# Patient Record
Sex: Male | Born: 1950 | Race: Black or African American | Hispanic: No | Smoking: Never smoker
Health system: Southern US, Community
[De-identification: ages and names within clinical notes are randomized; demographics above are authoritative.]

---

## 1997-06-02 ENCOUNTER — Emergency Department (HOSPITAL_COMMUNITY): Admission: EM | Admit: 1997-06-02 | Discharge: 1997-06-02 | Payer: Self-pay | Admitting: Emergency Medicine

## 1997-06-14 ENCOUNTER — Emergency Department (HOSPITAL_COMMUNITY): Admission: EM | Admit: 1997-06-14 | Discharge: 1997-06-14 | Payer: Self-pay | Admitting: Emergency Medicine

## 2003-02-06 ENCOUNTER — Emergency Department (HOSPITAL_COMMUNITY): Admission: EM | Admit: 2003-02-06 | Discharge: 2003-02-06 | Payer: Self-pay | Admitting: Emergency Medicine

## 2003-11-10 ENCOUNTER — Emergency Department (HOSPITAL_COMMUNITY): Admission: EM | Admit: 2003-11-10 | Discharge: 2003-11-10 | Payer: Self-pay | Admitting: Emergency Medicine

## 2004-04-08 ENCOUNTER — Inpatient Hospital Stay (HOSPITAL_COMMUNITY): Admission: EM | Admit: 2004-04-08 | Discharge: 2004-04-12 | Payer: Self-pay | Admitting: Emergency Medicine

## 2004-04-16 ENCOUNTER — Ambulatory Visit: Payer: Self-pay | Admitting: Family Medicine

## 2004-04-27 ENCOUNTER — Ambulatory Visit (HOSPITAL_COMMUNITY): Admission: RE | Admit: 2004-04-27 | Discharge: 2004-04-27 | Payer: Self-pay | Admitting: Family Medicine

## 2005-10-02 IMAGING — CT CT PELVIS W/ CM
1 of 3 series · 14 of 32 positions shown, 19 images · IV contrast (GASTRO & 120 ML OMNI 300)
Comparison: none

CLINICAL DATA: 52-year-old with lower abdominal pain and nausea.  
CT SCAN OF THE ABDOMEN AND PELVIS WITH ORAL AND IV CONTRAST ? 11/10/2003 
No comparisons.
TECHNIQUE: 120 cc Omnipaque 300 contrast was administered intravenously.  Helical imaging performed through the abdomen and pelvis.

[Series 2: routine abdomen · axial · 0.78mm/px · z∈[-460,-25]mm · 14 of 97 slices shown, 19 images]
[im 5/97  soft-tissue]
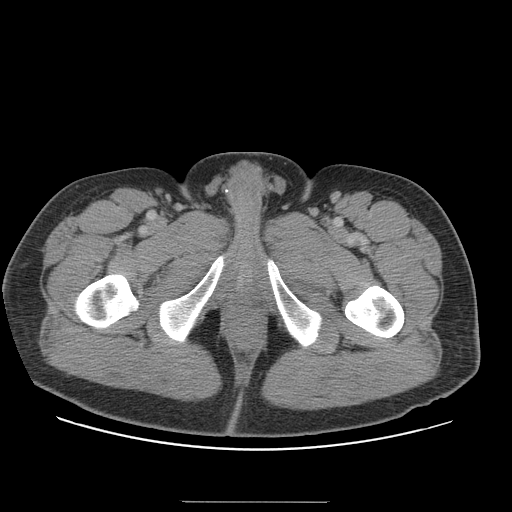
[im 5/97  bone]
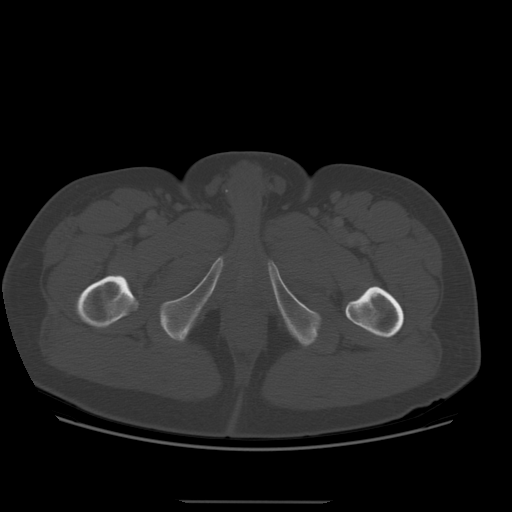
[im 15/97  soft-tissue]
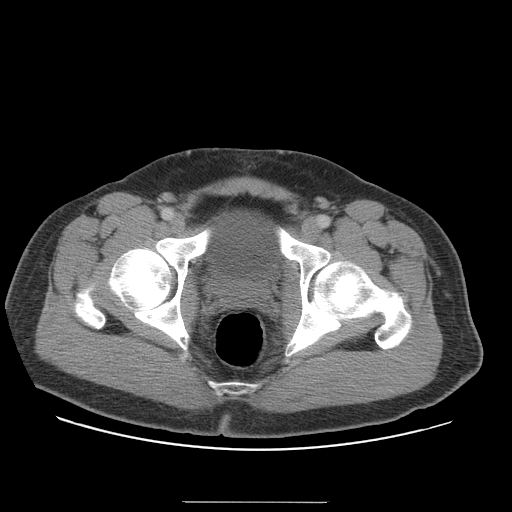
[im 20/97  soft-tissue]
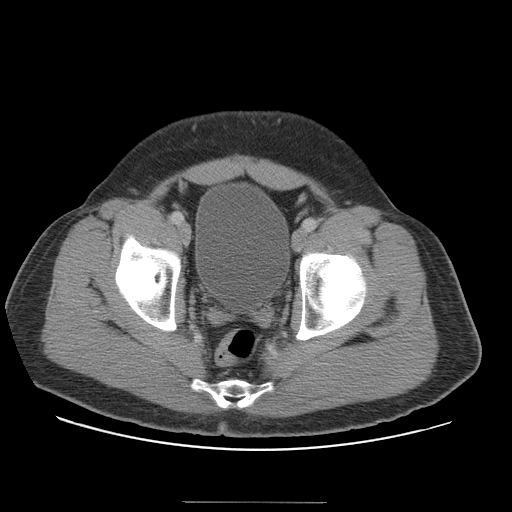
[im 29/97  soft-tissue]
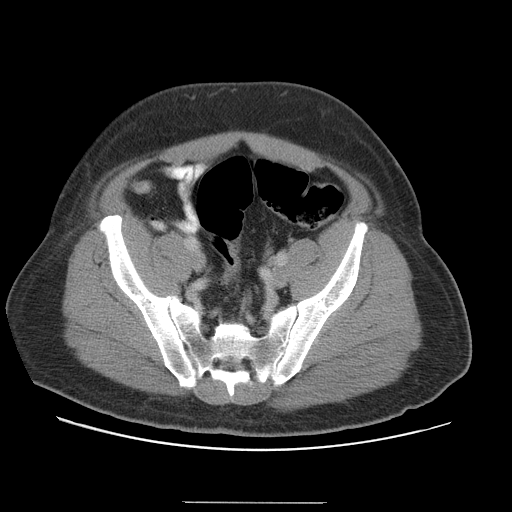
[im 34/97  soft-tissue]
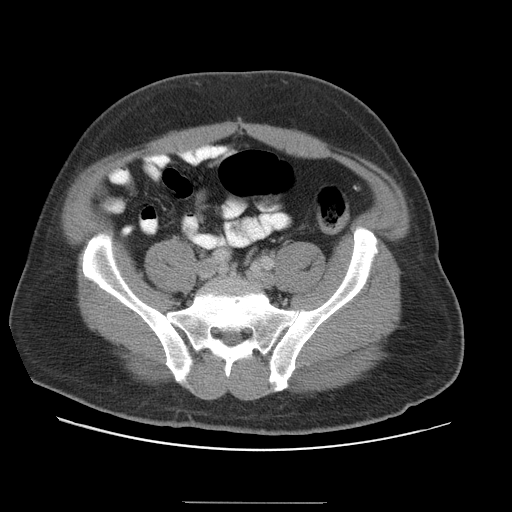
[im 44/97  soft-tissue]
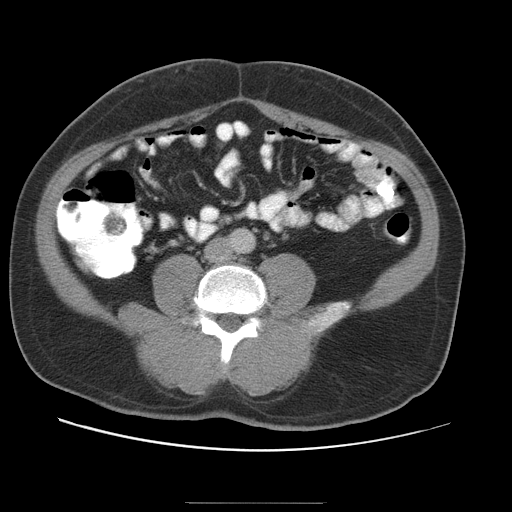
[im 49/97  soft-tissue]
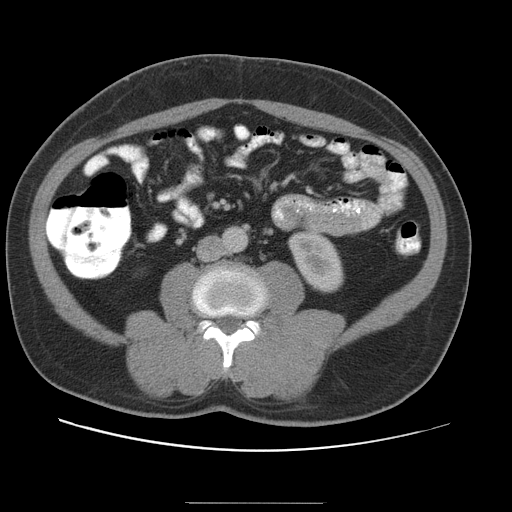
[im 53/97  soft-tissue]
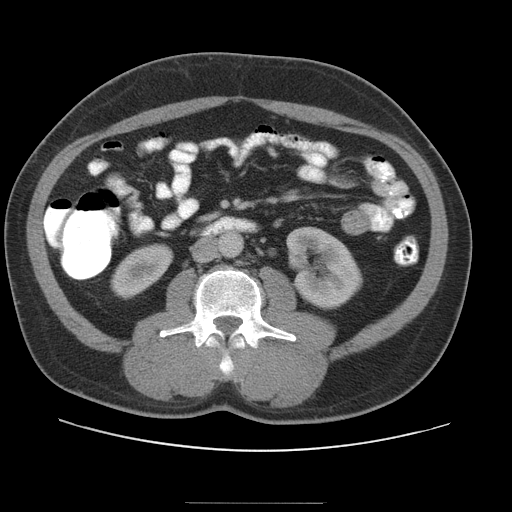
[im 63/97  soft-tissue]
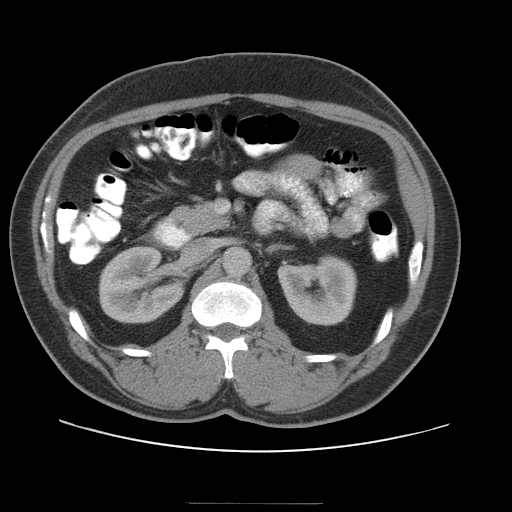
[im 63/97  bone]
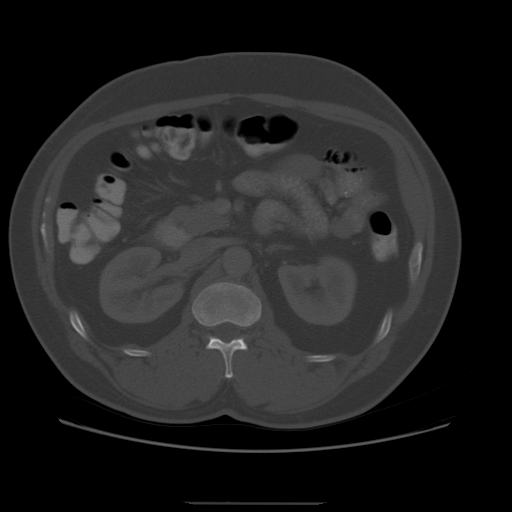
[im 68/97  soft-tissue]
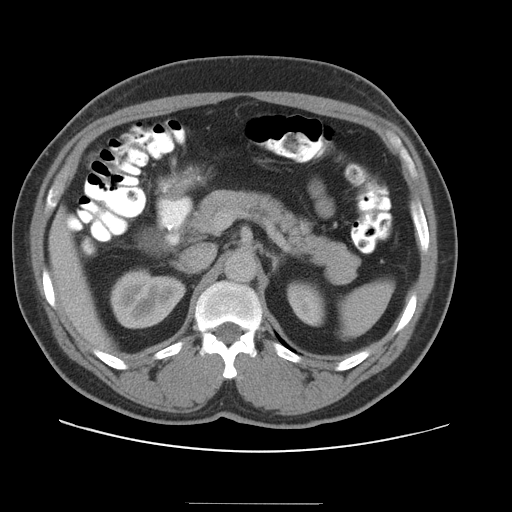
[im 77/97  soft-tissue]
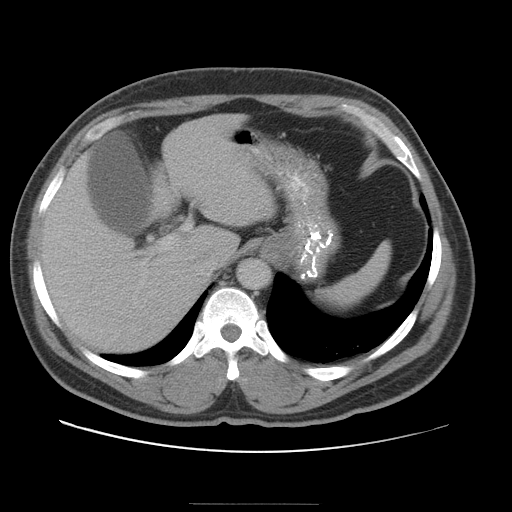
[im 77/97  lung]
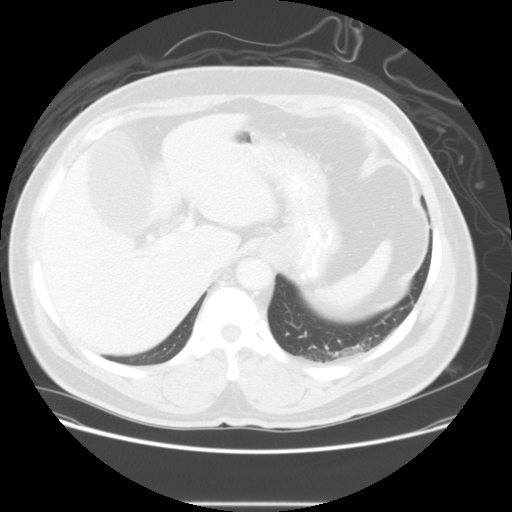
[im 82/97  soft-tissue]
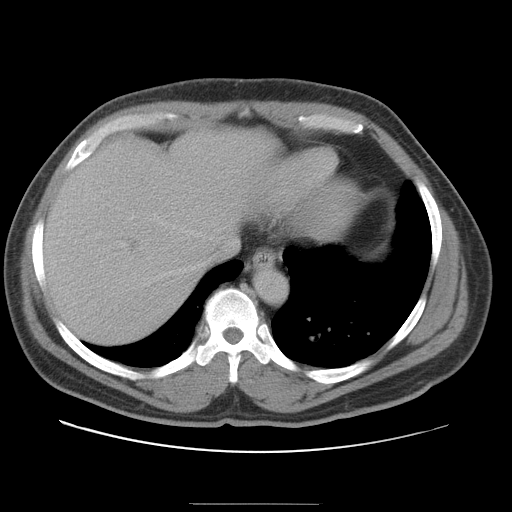
[im 82/97  lung]
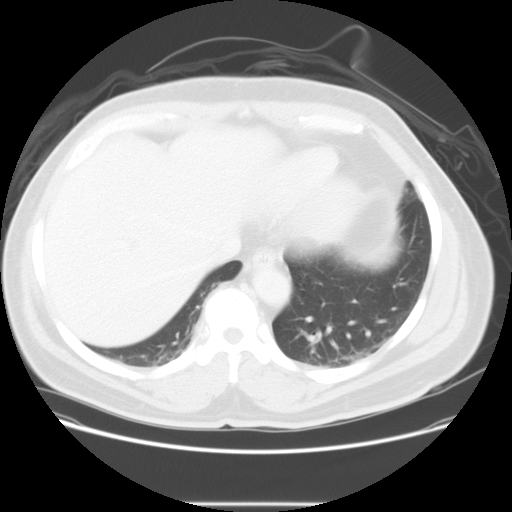
[im 87/97  lung]
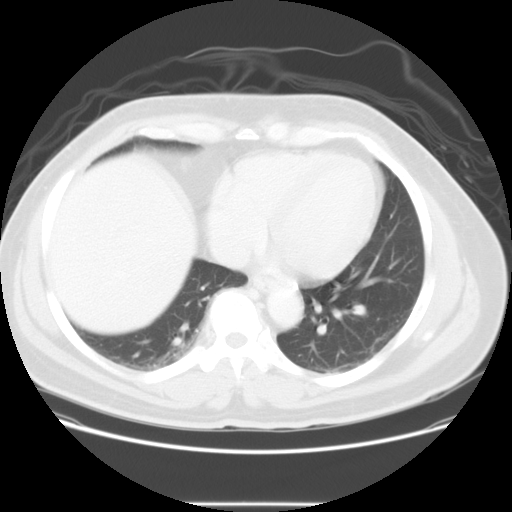
[im 92/97  soft-tissue]
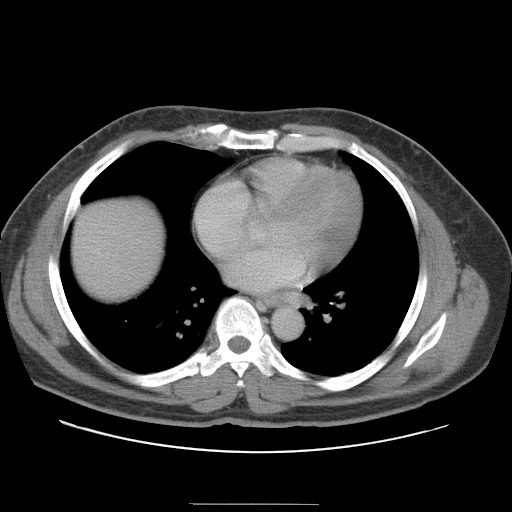
[im 92/97  lung]
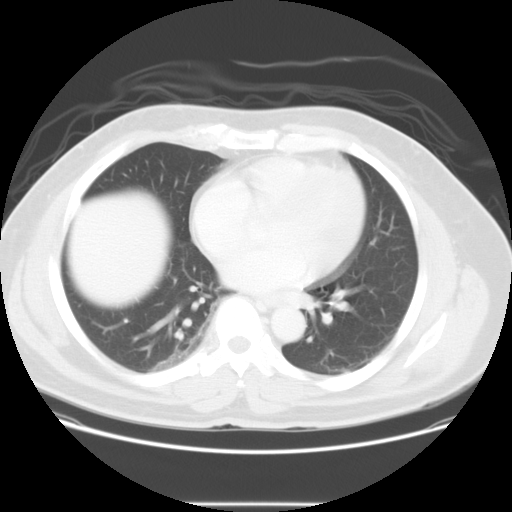

[14 of 32 positions shown; findings below may reference images not displayed]

FINDINGS: ABDOMEN CT WITH CONTRAST
There is minimal right greater than left atelectasis.  No pericardial or pleural fluid.  Heart size is normal.  The liver, biliary system, pancreas, kidneys, adrenal glands and spleen are all normal.  The gallbladder is moderately distended without definite stone disease or wall thickening.  Along the duodenal C-loop, there appears to be a subtle amount of adjacent inflammation or fluid in the mesenteric fat. This is present on images 31 through 37.  These changes are nonspecific in appearance and there is no associated definite bowel wall thickening, free air, or perforation. No obstruction.  The findings could be related to duodenitis or duodenal ulcer. Otherwise, the visualized small and large bowel are unremarkable. In the right lower quadrant, the appendix is normal.  
IMPRESSION
Nonspecific periduodenal edema versus inflammation without definite wall thickening, free air, or perforation. Changes could represent duodenitis versus findings related to duodenal ulcer disease.  Recommend correlation clinically.  
Mildly distended gallbladder without definite stone disease or wall thickening.  No biliary dilatation.  
Normal appendix.
CT PELVIS WITH CONTRAST
Prominent inguinal lymph nodes are noted bilaterally.  No acute inflammation, definite lymph adenopathy or ascites.  
IMPRESSION
No acute findings in the pelvis.

## 2007-03-30 ENCOUNTER — Emergency Department (HOSPITAL_COMMUNITY): Admission: EM | Admit: 2007-03-30 | Discharge: 2007-03-30 | Payer: Self-pay | Admitting: Emergency Medicine
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
## 2010-07-17 NOTE — Discharge Summary (Signed)
[name], Ross NO.:  [id number]   MEDICAL RECORD NO.:  [id number]          PATIENT TYPE:  INP   LOCATION:  4728                         FACILITY:  [facility]   PHYSICIAN:  Mallory Shirk, MD     DATE OF BIRTH:  1950/09/04   DATE OF ADMISSION:  04/08/2004  DATE OF DISCHARGE:  04/12/2004                                 DISCHARGE SUMMARY   DISCHARGE DIAGNOSES:  1.  Upper gastrointestinal bleed with esophagogastroduodenoscopy showing      bleeding duodenal ulcer versus a Dieulafoy's lesion.  2.  Anemia.   DISCHARGE MEDICATIONS:  1.  Prilosec over-the-counter 20 mg one tablet p.o. b.i.d. x7 days, then one      tablet p.o. daily x4 weeks.  2.  Tylenol 325 mg one to two tablets p.o. q.6h. p.r.n. pain.  3.  Ambien 10 mg one tablet p.o. q.h.s. p.r.n. insomnia.   FOLLOW-UP APPOINTMENTS:  With [facility] primary care physician in one  week at which time a CBC should be checked by the primary care physician.   HISTORY OF PRESENT ILLNESS:  [name] is a very pleasant 60 year old  African American gentleman with a past medical history significant for  hypertension, untreated, presented with a two to three day history of bright  red blood per rectum without significant nausea and vomiting, diaphoresis,  lightheadedness, syncope.  Denied any chest pain, shortness of breath,  dyspnea on exertion or syncopal episodes.  Denied any substantial alcohol  use recently.  No illicit drug use.  Also denied any melanotic stool.  No  similar episodes in the past.  The patient takes a Goody's Powder for  headaches and backaches which he has been taking more than usual because of  a recent fall from the room with right-sided pain.  Since then, he has not  had any other complaints other than minimal low back soreness.   PAST MEDICAL HISTORY:  1.  Hypertension, unknown day of diagnosis.  2.  History of distant alcohol abuse.   MEDICATIONS:  Over-the-counter NSAID products, (BC  Powder, Goody's Powder,  aspirin, Motrin).   ALLERGIES:  None.   PHYSICAL EXAMINATION ON ADMISSION:  VITAL SIGNS:  Blood pressure 120/57,  pulse 101, respirations 22, saturations 100% on room air, temperature 98.7.  GENERAL:  A young, African American gentleman in no acute distress, alert  and oriented x3.  HEENT:  Normocephalic, atraumatic.  PERRLA.  Sclerae anicteric.  Mucous  membranes moist.  Oropharynx not erythematous.  NECK:  Supple.  No LAD.  No JVD.  LUNGS:  Clear to auscultation bilaterally with no wheezes or rales.  ABDOMEN:  Soft, positive bowel sounds.  Diffuse epigastric tenderness.  No  rebounds, no guarding.  RECTAL:  Hemoccult-positive stool per emergency department M.D.  EXTREMITIES:  No cyanosis, clubbing or edema.  NEUROLOGICAL:  Nonfocal.   LABORATORIES ON ADMISSION:  WBC is 14.4, hemoglobin 7.1, hematocrit 21.3,  MCV 96, platelets 297,000.  Sodium 138, potassium 4.0, chloride 109, bicarb  26, glucose 139, BUN 29, creatinine 1.1, calcium 8.2, total protein 4.9,  albumin 2.2, AST 10, ALT  10, alkaline phosphatase 43, total bilirubin 0.4.   HOSPITAL COURSE:  The patient was admitted to a monitored bed.  Problem 1.  Gastrointestinal blood.  The patient was seen by  gastroenterology, Dr. Danise Edge.  An EGD showed arterial bleeding in  the distal duodenal bulb, either due to an ulcer or a Dieulafoy's lesion.  The recommendation was to start Protonix initially 40 mg p.o. b.i.d. and  then subsequently upon discharge, p.o. daily.  Biopsies were taken for  Helicobacter pylori.  These results are still pending.  If Helicobacter  pylori is present, then the patient will be started on a regimen of Biaxin,  Prilosec and Amoxicillin.   Problem 2.  Anemia.  The patient's admission hemoglobin was 7.1 with  hematocrit 21.3.  He was transfused two units packed red blood cells.  There  were no episodes of active bleeding during the rest of his hospital stay.  On the day of  discharge, the patient's hematocrit was 25.7, hemoglobin 8.7.  He was seen by GI and deemed stable to be discharged.   The patient will be followed up by [facility] care physician and  his hemoglobin and hematocrit will be checked within one week.  The patient  has been advised to take his medications as prescribed.  He has been advised  to stay away from aspirin, Motrin, Aleve, Goody's Powder, BC Powder or any  such medications.  He will be taking Tylenol for pain.  The patient  understood these instructions and has agreed to follow them.  He was also  advised to return to the emergency department immediately upon onset of  nausea and vomiting, dizziness, bright red blood per rectum, chest pain or  any other symptoms that might need immediate medical attention.      GDK/MEDQ  D:  04/12/2004  T:  04/12/2004  Job:  [id number]   cc:   Danise Edge, M.D.  [address]  [place]  [place] [address]  Fax: [phone]

## 2010-07-17 NOTE — Op Note (Signed)
NAMEAMARA, MANALANG NO.:  [id number]   MEDICAL RECORD NO.:  [id number]          PATIENT TYPE:  INP   LOCATION:  4728                         FACILITY:  [facility]   PHYSICIAN:  Ross Valdez, M.D.   DATE OF BIRTH:  1950/10/12   DATE OF PROCEDURE:  04/08/2004  DATE OF DISCHARGE:                                 OPERATIVE REPORT   PROCEDURE:  Colonoscopy.   PROCEDURE INDICATION:  [name] is a 60 year old male born  1950/05/31.  [name] was admitted through the [facility]  emergency room earlier this morning with sign of upper gastrointestinal  bleeding.  He does use over-the-counter NSAIDs for headaches and backaches.   MEDICATION ALLERGIES:  NONE.   CURRENT MEDICATIONS:  Over-the-counter NSAIDs.   PAST MEDICAL HISTORY:  1.  Hypertension.  2.  Remote alcohol use.   FAMILY HISTORY:  Noncontributory.   HABITS:  Ross Valdez quit consuming alcohol over 10 years ago.  He does not  smoke cigarettes.   ENDOSCOPIST:  Ross Valdez, M.D.   PREMEDICATION:  Fentanyl 50 mcg, Versed 10 mg.   DESCRIPTION OF PROCEDURE:  After obtaining informed consent, [name] was  placed in the left lateral decubitus position.  I administered intravenous  fentanyl and intravenous Versed to achieve conscious sedation for the  procedure.  The patient's blood pressure, oxygen saturation, and cardiac  rhythm were monitored throughout the procedure and documented in the medical  record.   The Olympus gastroscope was passed through the posterior hypopharynx and  into the proximal esophagus without difficulty.  The hypopharynx, larynx and  vocal cords appeared normal.   Esophagoscopy:  The proximal mid segments of the esophageal appeared normal.  There is an isolated erosion at the esophagogastric junction which is not  bleeding.   Gastroscopy:  A retroflex view of the gastric cardia and fundus was normal.  The gastric body, antrum and pylorus appeared  normal.   Duodenoscopy:  In the distal duodenal bulb, there is either a bleeding  Dieulafoy's lesion versus a small duodenal ulcer with arterial bleeder.  The  descending duodenum appears normal.   To achieve hemostasis, 5 mL of epinephrine was injected into the arterial  bleeder and the lesion was endo clipped with two clips.   ASSESSMENT:  Arterial bleeding in the distal duodenal bulb due either to a  Dieulafoy's lesion or ulcer.   RECOMMENDATIONS:  Protonix 40 mg b.i.d., clear liquid diet, monitor  hemoglobin every 6 hours over the next 48 hours, transfuse 2 units of packed  red blood cells if hemoglobin falls below 8.5 grams.  If significant re-  bleeding occurs, repeat endoscopy for endoscopic therapy.      MJ/MEDQ  D:  04/08/2004  T:  04/08/2004  Job:  [id number]
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
## 2010-11-19 LAB — I-STAT 8, (EC8 V) (CONVERTED LAB)
Acid-Base Excess: 4 — ABNORMAL HIGH
BUN: 8
Bicarbonate: 31.3 — ABNORMAL HIGH
Chloride: 106
Glucose, Bld: 111 — ABNORMAL HIGH
HCT: 49
Hemoglobin: 16.7
Operator id: 270651
Potassium: 4.1
Sodium: 140
TCO2: 33
pCO2, Ven: 57.8 — ABNORMAL HIGH
pH, Ven: 7.342 — ABNORMAL HIGH

## 2010-11-19 LAB — POCT CARDIAC MARKERS
CKMB, poc: <1 — ABNORMAL LOW
Myoglobin, poc: 129
Operator id: 270651
Troponin i, poc: <0.05

## 2010-11-19 LAB — POCT I-STAT CREATININE
Creatinine, Ser: 1.1
Operator id: 270651

## 2012-03-19 ENCOUNTER — Emergency Department (HOSPITAL_COMMUNITY)
Admission: EM | Admit: 2012-03-19 | Discharge: 2012-03-19 | Disposition: A | Discharge status: Home / Self Care | Payer: No Typology Code available for payment source | Attending: Emergency Medicine | Admitting: Emergency Medicine

## 2012-03-19 ENCOUNTER — Encounter (HOSPITAL_COMMUNITY): Payer: Self-pay

## 2012-03-19 DIAGNOSIS — Y9389 Activity, other specified: Secondary | ICD-10-CM | POA: Insufficient documentation

## 2012-03-19 DIAGNOSIS — Y92410 Unspecified street and highway as the place of occurrence of the external cause: Secondary | ICD-10-CM | POA: Insufficient documentation

## 2012-03-19 DIAGNOSIS — IMO0002 Reserved for concepts with insufficient information to code with codable children: Secondary | ICD-10-CM | POA: Insufficient documentation

## 2012-03-19 MED ORDER — IBUPROFEN 400 MG PO TABS: 400.0000 mg | ORAL_TABLET | Freq: Four times a day (QID) | ORAL | Status: DC | PRN

## 2012-03-19 MED ORDER — CYCLOBENZAPRINE HCL 10 MG PO TABS: 5.0000 mg | ORAL_TABLET | Freq: Three times a day (TID) | ORAL | Status: DC | PRN
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
## 2012-03-20 NOTE — ED Provider Notes (Signed)
Medical screening examination/treatment/procedure(s) were performed by non-physician practitioner and as supervising physician I was immediately available for consultation/collaboration.   Carleene Cooper III, MD 03/20/12 1247

## 2014-05-01 ENCOUNTER — Emergency Department (INDEPENDENT_AMBULATORY_CARE_PROVIDER_SITE_OTHER)
Admission: EM | Admit: 2014-05-01 | Discharge: 2014-05-01 | Disposition: A | Discharge status: Home / Self Care | Payer: Self-pay | Source: Home / Self Care | Attending: Emergency Medicine | Admitting: Emergency Medicine

## 2014-05-01 ENCOUNTER — Encounter (HOSPITAL_COMMUNITY): Payer: Self-pay

## 2014-05-01 DIAGNOSIS — IMO0001 Reserved for inherently not codable concepts without codable children: Secondary | ICD-10-CM

## 2014-05-01 DIAGNOSIS — R030 Elevated blood-pressure reading, without diagnosis of hypertension: Secondary | ICD-10-CM

## 2014-05-01 DIAGNOSIS — J069 Acute upper respiratory infection, unspecified: Secondary | ICD-10-CM

## 2014-05-01 MED ORDER — BENZONATATE 100 MG PO CAPS: 100.0000 mg | ORAL_CAPSULE | Freq: Three times a day (TID) | ORAL | Status: DC | PRN

## 2014-05-01 MED ORDER — IPRATROPIUM BROMIDE 0.06 % NA SOLN: 2.0000 | Freq: Four times a day (QID) | NASAL | Status: DC

## 2014-05-01 NOTE — ED Notes (Signed)
Reportedly has had URI type symptoms x past 3 days

## 2014-05-01 NOTE — Discharge Instructions (Signed)
You are suffering from a common cold. Please use medications as prescribed for your symptoms, drink plenty of fluids and rest as needed. Tylenol as directed on packaging for pain and fever. You should be aware that your blood pressure is elevated here today and I would strongly encourage you to locate a primary care doctor so that this can be evaluated and treated.   Hypertension Hypertension, commonly called high blood pressure, is when the force of blood pumping through your arteries is too strong. Your arteries are the blood vessels that carry blood from your heart throughout your body. A blood pressure reading consists of a higher number over a lower number, such as 110/72. The higher number (systolic) is the pressure inside your arteries when your heart pumps. The lower number (diastolic) is the pressure inside your arteries when your heart relaxes. Ideally you want your blood pressure below 120/80. Hypertension forces your heart to work harder to pump blood. Your arteries may become narrow or stiff. Having hypertension puts you at risk for heart disease, stroke, and other problems.  RISK FACTORS Some risk factors for high blood pressure are controllable. Others are not.  Risk factors you cannot control include:   Race. You may be at higher risk if you are African American.  Age. Risk increases with age.  Gender. Men are at higher risk than women before age 16 years. After age 40, women are at higher risk than men. Risk factors you can control include:  Not getting enough exercise or physical activity.  Being overweight.  Getting too much fat, sugar, calories, or salt in your diet.  Drinking too much alcohol. SIGNS AND SYMPTOMS Hypertension does not usually cause signs or symptoms. Extremely high blood pressure (hypertensive crisis) may cause headache, anxiety, shortness of breath, and nosebleed. DIAGNOSIS  To check if you have hypertension, your health care provider will measure your  blood pressure while you are seated, with your arm held at the level of your heart. It should be measured at least twice using the same arm. Certain conditions can cause a difference in blood pressure between your right and left arms. A blood pressure reading that is higher than normal on one occasion does not mean that you need treatment. If one blood pressure reading is high, ask your health care provider about having it checked again. TREATMENT  Treating high blood pressure includes making lifestyle changes and possibly taking medicine. Living a healthy lifestyle can help lower high blood pressure. You may need to change some of your habits. Lifestyle changes may include:  Following the DASH diet. This diet is high in fruits, vegetables, and whole grains. It is low in salt, red meat, and added sugars.  Getting at least 2 hours of brisk physical activity every week.  Losing weight if necessary.  Not smoking.  Limiting alcoholic beverages.  Learning ways to reduce stress. If lifestyle changes are not enough to get your blood pressure under control, your health care provider may prescribe medicine. You may need to take more than one. Work closely with your health care provider to understand the risks and benefits. HOME CARE INSTRUCTIONS  Have your blood pressure rechecked as directed by your health care provider.   Take medicines only as directed by your health care provider. Follow the directions carefully. Blood pressure medicines must be taken as prescribed. The medicine does not work as well when you skip doses. Skipping doses also puts you at risk for problems.   Do not smoke.  Monitor your blood pressure at home as directed by your health care provider. SEEK MEDICAL CARE IF:   You think you are having a reaction to medicines taken.  You have recurrent headaches or feel dizzy.  You have swelling in your ankles.  You have trouble with your vision. SEEK IMMEDIATE MEDICAL  CARE IF:  You develop a severe headache or confusion.  You have unusual weakness, numbness, or feel faint.  You have severe chest or abdominal pain.  You vomit repeatedly.  You have trouble breathing. MAKE SURE YOU:   Understand these instructions.  Will watch your condition.  Will get help right away if you are not doing well or get worse. Document Released: 02/15/2005 Document Revised: 07/02/2013 Document Reviewed: 12/08/2012 [facility] Patient Information 2015 [employer], [place]. This information is not intended to replace advice given to you by your health care provider. Make sure you discuss any questions you have with your health care provider.  Upper Respiratory Infection, Adult An upper respiratory infection (URI) is also sometimes known as the common cold. The upper respiratory tract includes the nose, sinuses, throat, trachea, and bronchi. Bronchi are the airways leading to the lungs. Most people improve within 1 week, but symptoms can last up to 2 weeks. A residual cough may last even longer.  CAUSES Many different viruses can infect the tissues lining the upper respiratory tract. The tissues become irritated and inflamed and often become very moist. Mucus production is also common. A cold is contagious. You can easily spread the virus to others by oral contact. This includes kissing, sharing a glass, coughing, or sneezing. Touching your mouth or nose and then touching a surface, which is then touched by another person, can also spread the virus. SYMPTOMS  Symptoms typically develop 1 to 3 days after you come in contact with a cold virus. Symptoms vary from person to person. They may include:  Runny nose.  Sneezing.  Nasal congestion.  Sinus irritation.  Sore throat.  Loss of voice (laryngitis).  Cough.  Fatigue.  Muscle aches.  Loss of appetite.  Headache.  Low-grade fever. DIAGNOSIS  You might diagnose your own cold based on familiar symptoms, since most  people get a cold 2 to 3 times a year. Your caregiver can confirm this based on your exam. Most importantly, your caregiver can check that your symptoms are not due to another disease such as strep throat, sinusitis, pneumonia, asthma, or epiglottitis. Blood tests, throat tests, and X-rays are not necessary to diagnose a common cold, but they may sometimes be helpful in excluding other more serious diseases. Your caregiver will decide if any further tests are required. RISKS AND COMPLICATIONS  You may be at risk for a more severe case of the common cold if you smoke cigarettes, have chronic heart disease (such as heart failure) or lung disease (such as asthma), or if you have a weakened immune system. The very young and very old are also at risk for more serious infections. Bacterial sinusitis, middle ear infections, and bacterial pneumonia can complicate the common cold. The common cold can worsen asthma and chronic obstructive pulmonary disease (COPD). Sometimes, these complications can require emergency medical care and may be life-threatening. PREVENTION  The best way to protect against getting a cold is to practice good hygiene. Avoid oral or hand contact with people with cold symptoms. Wash your hands often if contact occurs. There is no clear evidence that vitamin C, vitamin E, echinacea, or exercise reduces the chance of developing  a cold. However, it is always recommended to get plenty of rest and practice good nutrition. TREATMENT  Treatment is directed at relieving symptoms. There is no cure. Antibiotics are not effective, because the infection is caused by a virus, not by bacteria. Treatment may include:  Increased fluid intake. Sports drinks offer valuable electrolytes, sugars, and fluids.  Breathing heated mist or steam (vaporizer or shower).  Eating chicken soup or other clear broths, and maintaining good nutrition.  Getting plenty of rest.  Using gargles or lozenges for  comfort.  Controlling fevers with ibuprofen or acetaminophen as directed by your caregiver.  Increasing usage of your inhaler if you have asthma. Zinc gel and zinc lozenges, taken in the first 24 hours of the common cold, can shorten the duration and lessen the severity of symptoms. Pain medicines may help with fever, muscle aches, and throat pain. A variety of non-prescription medicines are available to treat congestion and runny nose. Your caregiver can make recommendations and may suggest nasal or lung inhalers for other symptoms.  HOME CARE INSTRUCTIONS   Only take over-the-counter or prescription medicines for pain, discomfort, or fever as directed by your caregiver.  Use a warm mist humidifier or inhale steam from a shower to increase air moisture. This may keep secretions moist and make it easier to breathe.  Drink enough water and fluids to keep your urine clear or pale yellow.  Rest as needed.  Return to work when your temperature has returned to normal or as your caregiver advises. You may need to stay home longer to avoid infecting others. You can also use a face mask and careful hand washing to prevent spread of the virus. SEEK MEDICAL CARE IF:   After the first few days, you feel you are getting worse rather than better.  You need your caregiver's advice about medicines to control symptoms.  You develop chills, worsening shortness of breath, or brown or red sputum. These may be signs of pneumonia.  You develop yellow or brown nasal discharge or pain in the face, especially when you bend forward. These may be signs of sinusitis.  You develop a fever, swollen neck glands, pain with swallowing, or white areas in the back of your throat. These may be signs of strep throat. SEEK IMMEDIATE MEDICAL CARE IF:   You have a fever.  You develop severe or persistent headache, ear pain, sinus pain, or chest pain.  You develop wheezing, a prolonged cough, cough up blood, or have a  change in your usual mucus (if you have chronic lung disease).  You develop sore muscles or a stiff neck. Document Released: 08/11/2000 Document Revised: 05/10/2011 Document Reviewed: 05/23/2013 [facility] Patient Information 2015 [place], [place]. This information is not intended to replace advice given to you by your health care provider. Make sure you discuss any questions you have with your health care provider.

## 2014-05-01 NOTE — ED Provider Notes (Signed)
CSN: [id number]     Arrival date & time 05/01/14  1450 History   First MD Initiated Contact with Patient 05/01/14 1608     Chief Complaint  Patient presents with  . Cough   (Consider location/radiation/quality/duration/timing/severity/associated sxs/prior Treatment) HPI Comments: PCP: none Nonsmoker retired  Patient is a 64 y.o. male presenting with URI. The history is provided by the patient.  URI Presenting symptoms: congestion, cough, rhinorrhea and sore throat   Presenting symptoms: no ear pain, no facial pain, no fatigue and no fever   Severity:  Moderate Onset quality:  Gradual Duration:  5 days Timing:  Constant Progression:  Unchanged Chronicity:  New Associated symptoms: no myalgias and no wheezing   Associated symptoms comment:  +post nasal drainage   History reviewed. No pertinent past medical history. History reviewed. No pertinent past surgical history. History reviewed. No pertinent family history. History  Substance Use Topics  . Smoking status: Never Smoker   . Smokeless tobacco: Not on file  . Alcohol Use: No    Review of Systems  Constitutional: Negative for fever and fatigue.  HENT: Positive for congestion, rhinorrhea and sore throat. Negative for ear pain.   Eyes: Negative.   Respiratory: Positive for cough. Negative for chest tightness, shortness of breath and wheezing.   Cardiovascular: Negative.   Gastrointestinal: Negative.   Musculoskeletal: Negative for myalgias.  Skin: Negative.     Allergies  Review of patient's allergies indicates no known allergies.  Home Medications   Prior to Admission medications   Medication Sig Start Date End Date Taking? Authorizing Provider  benzonatate (TESSALON) 100 MG capsule Take 1 capsule (100 mg total) by mouth 3 (three) times daily as needed for cough. 05/01/14   Mathis Fare Latressa Harries, PA  cyclobenzaprine (FLEXERIL) 10 MG tablet Take 0.5 tablets (5 mg total) by mouth 3 (three) times daily as needed for  muscle spasms. 03/19/12   Lisette Paz, PA-C  ibuprofen (ADVIL,MOTRIN) 400 MG tablet Take 1 tablet (400 mg total) by mouth every 6 (six) hours as needed for pain. 03/19/12   Lisette Paz, PA-C  ipratropium (ATROVENT) 0.06 % nasal spray Place 2 sprays into both nostrils 4 (four) times daily. 05/01/14   Jess Barters H Ellysa Parrack, PA   BP 162/99 mmHg  Pulse 92  Temp(Src) 98.9 F (37.2 C) (Oral)  Resp 16  SpO2 98% Physical Exam  Constitutional: He is oriented to person, place, and time. He appears well-developed and well-nourished.  HENT:  Head: Normocephalic and atraumatic.  Right Ear: Hearing, tympanic membrane, external ear and ear canal normal.  Left Ear: Hearing, tympanic membrane, external ear and ear canal normal.  Nose: Nose normal.  Mouth/Throat: Uvula is midline, oropharynx is clear and moist and mucous membranes are normal.  Eyes: Conjunctivae are normal.  Neck: Normal range of motion. Neck supple.  Cardiovascular: Normal rate, regular rhythm and normal heart sounds.   Pulmonary/Chest: Effort normal and breath sounds normal.  Lymphadenopathy:    He has no cervical adenopathy.  Neurological: He is alert and oriented to person, place, and time.  Skin: Skin is warm and dry.  Psychiatric: He has a normal mood and affect. His behavior is normal.  Nursing note and vitals reviewed.   ED Course  Procedures (including critical care time) Labs Review Labs Reviewed - No data to display  Imaging Review No results found.   MDM   1. URI (upper respiratory infection)   2. Elevated blood pressure   You are suffering from a  common cold. Please use medications as prescribed for your symptoms, drink plenty of fluids and rest as needed. Tylenol as directed on packaging for pain and fever. You should be aware that your blood pressure is elevated here today and I would strongly encourage you to locate a primary care doctor so that this can be evaluated and treated.  Atrovent nasal spray and  tessalon as prescribed Follow up if no improvement over next several days    Ria ClockJennifer Lee H Jezabelle Chisolm, PA 05/01/14 1946

## 2016-12-12 ENCOUNTER — Emergency Department (HOSPITAL_COMMUNITY)
Admission: EM | Admit: 2016-12-12 | Discharge: 2016-12-12 | Disposition: A | Discharge status: Home / Self Care | Payer: Medicare Other | Attending: Emergency Medicine | Admitting: Emergency Medicine

## 2016-12-12 ENCOUNTER — Encounter (HOSPITAL_COMMUNITY): Payer: Self-pay | Admitting: Emergency Medicine

## 2016-12-12 DIAGNOSIS — Y92009 Unspecified place in unspecified non-institutional (private) residence as the place of occurrence of the external cause: Secondary | ICD-10-CM | POA: Insufficient documentation

## 2016-12-12 DIAGNOSIS — Y999 Unspecified external cause status: Secondary | ICD-10-CM | POA: Insufficient documentation

## 2016-12-12 DIAGNOSIS — Y9301 Activity, walking, marching and hiking: Secondary | ICD-10-CM | POA: Insufficient documentation

## 2016-12-12 DIAGNOSIS — S01411A Laceration without foreign body of right cheek and temporomandibular area, initial encounter: Secondary | ICD-10-CM | POA: Insufficient documentation

## 2016-12-12 DIAGNOSIS — W19XXXA Unspecified fall, initial encounter: Secondary | ICD-10-CM | POA: Insufficient documentation

## 2016-12-12 DIAGNOSIS — S0181XA Laceration without foreign body of other part of head, initial encounter: Secondary | ICD-10-CM

## 2016-12-12 NOTE — ED Provider Notes (Signed)
[facility]-EMERGENCY DEPT Provider Note   CSN: [id number] Arrival date & time: 12/12/16  1109     History   Chief Complaint Chief Complaint  Patient presents with  . Fall  . Facial Pain    HPI Ross Valdez is a 66 y.o. male.  He injured his right cheek when he fell today, while walking in the dark.  He only injured his right cheek.  He denies headache, neck pain, jaw pain, weakness or dizziness.  No other preceding symptoms, and no other complaints.  There are no other known modifying factors.  HPI  History reviewed. No pertinent past medical history.  There are no active problems to display for this patient.   History reviewed. No pertinent surgical history.     Home Medications    Prior to Admission medications   Not on File    Family History No family history on file.  Social History Social History  Substance Use Topics  . Smoking status: Never Smoker  . Smokeless tobacco: Never Used  . Alcohol use No     Allergies   Patient has no known allergies.   Review of Systems Review of Systems  All other systems reviewed and are negative.    Physical Exam Updated Vital Signs BP (!) 143/82 (BP Location: Right Arm)   Pulse 73   Temp 98.2 F (36.8 C) (Oral)   Resp 16   Ht  (1.727 m)   Wt 83.9 kg (185 lb)   SpO2 99%   BMI 28.13 kg/m   Physical Exam  Constitutional: He is oriented to person, place, and time. He appears well-developed and well-nourished. No distress.  HENT:  Head: Normocephalic.  Right Ear: External ear normal.  Left Ear: External ear normal.  Superficial gaping laceration right mid cheek.  No trauma to right ear, or right jaw.  No trismus.  Eyes: Pupils are equal, round, and reactive to light. Conjunctivae and EOM are normal.  Neck: Normal range of motion and phonation normal. Neck supple.  Cardiovascular: Normal rate.   Pulmonary/Chest: Effort normal. He exhibits no bony tenderness.  Musculoskeletal: Normal range of  motion. He exhibits no deformity.  Normal range of motion neck and back.  Neurological: He is alert and oriented to person, place, and time. No cranial nerve deficit or sensory deficit. He exhibits normal muscle tone. Coordination normal.  No dysarthria, or aphasia.  Skin: Skin is warm, dry and intact.  Psychiatric: He has a normal mood and affect. His behavior is normal. Judgment and thought content normal.  Nursing note and vitals reviewed.    ED Treatments / Results  Labs (all labs ordered are listed, but only abnormal results are displayed) Labs Reviewed - No data to display  EKG  EKG Interpretation None       Radiology No results found.  Procedures .Marland KitchenLaceration Repair Date/Time: 12/12/2016 4:59 PM Performed by: Mancel Bale Authorized by: Mancel Bale   Consent:    Consent obtained:  Verbal   Consent given by:  Patient   Risks discussed:  Infection and pain   Alternatives discussed:  No treatment Anesthesia (see MAR for exact dosages):    Anesthesia method:  None Laceration details:    Location:  Face   Length (cm):  3.5   Depth (mm):  3 Repair type:    Repair type:  Simple Exploration:    Hemostasis achieved with:  Direct pressure   Wound exploration: wound explored through full range of motion  Contaminated: no   Treatment:    Amount of cleaning:  Standard   Irrigation solution:  Sterile water Skin repair:    Repair method:  Tissue adhesive Approximation:    Approximation:  Close   Vermilion border: well-aligned   Post-procedure details:    Dressing:  Open (no dressing)   Patient tolerance of procedure:  Tolerated well, no immediate complications    (including critical care time)  Medications Ordered in ED Medications - No data to display   Initial Impression / Assessment and Plan / ED Course  I have reviewed the triage vital signs and the nursing notes.  Pertinent labs & imaging results that were available during my care of the  patient were reviewed by me and considered in my medical decision making (see chart for details).      Patient Vitals for the past 24 hrs:  BP Temp Temp src Pulse Resp SpO2  12/12/16 1708 (!) 143/82 - - 73 16 99 %  12/12/16 1510 (!) 136/99 98.2 F (36.8 C) Oral 79 17 100 %    At discharge- reevaluation with update and discussion. After initial assessment and treatment, an updated evaluation reveals no further complaints.  Findings discussed with the patient and all questions were answered. Ross Valdez    Final Clinical Impressions(s) / ED Diagnoses   Final diagnoses:  Face lacerations, initial encounter   Mechanical fall with isolated contusion and small laceration right cheek.  Doubt fracture, cervical injury, head injury, hemodynamic instability.  Nursing Notes Reviewed/ Care Coordinated Applicable Imaging Reviewed Interpretation of Laboratory Data incorporated into ED treatment  The patient appears reasonably screened and/or stabilized for discharge and I doubt any other medical condition or other [facility] requiring further screening, evaluation, or treatment in the ED at this time prior to discharge.  Plan: Home Medications-continue current medications, OTC analgesia; Home Treatments-wound care; return here if the recommended treatment, does not improve the symptoms; Recommended follow up-return here if needed  \ New Prescriptions There are no discharge medications for this patient.    Mancel Bale, MD 12/13/16 [phone]

## 2016-12-12 NOTE — Discharge Instructions (Signed)
The glue should fall off in 4-5 days, after that should be well-healed.  For pain, use ibuprofen or acetaminophen.  Return here, if needed, for problems.

## 2016-12-12 NOTE — ED Notes (Signed)
Pt is stable and ambulatory states understanding discharge instructions

## 2016-12-12 NOTE — ED Notes (Signed)
Dermabond, saline, and gauze placed at pt bedside.

## 2016-12-12 NOTE — ED Triage Notes (Signed)
Pt. Stated, I fell today and hit the rt side of my face. My power was out and I couldn't see. Pt. Has an abrasion to rt. Side of face.

## 2016-12-12 NOTE — ED Notes (Signed)
Registration at bedside.

## 2018-11-14 ENCOUNTER — Emergency Department (HOSPITAL_COMMUNITY)
Admission: EM | Admit: 2018-11-14 | Discharge: 2018-11-15 | Disposition: A | Discharge status: Home / Self Care | Payer: Medicare HMO | Attending: Emergency Medicine | Admitting: Emergency Medicine

## 2018-11-14 ENCOUNTER — Other Ambulatory Visit: Payer: Self-pay

## 2018-11-14 ENCOUNTER — Encounter (HOSPITAL_COMMUNITY): Payer: Self-pay | Admitting: *Deleted

## 2018-11-14 DIAGNOSIS — R51 Headache: Secondary | ICD-10-CM | POA: Insufficient documentation

## 2018-11-14 DIAGNOSIS — R110 Nausea: Secondary | ICD-10-CM | POA: Insufficient documentation

## 2018-11-14 DIAGNOSIS — R42 Dizziness and giddiness: Secondary | ICD-10-CM | POA: Insufficient documentation

## 2018-11-14 LAB — CBC
HCT: 39.8 % (ref 39.0–52.0)
Hemoglobin: 13 g/dL (ref 13.0–17.0)
MCH: 28 pg (ref 26.0–34.0)
MCHC: 32.7 g/dL (ref 30.0–36.0)
MCV: 85.8 fL (ref 80.0–100.0)
Platelets: 251 10*3/uL (ref 150–400)
RBC: 4.64 MIL/uL (ref 4.22–5.81)
RDW: 12.7 % (ref 11.5–15.5)
WBC: 5 10*3/uL (ref 4.0–10.5)
nRBC: 0 % (ref 0.0–0.2)

## 2018-11-14 LAB — COMPREHENSIVE METABOLIC PANEL
ALT: 15 U/L (ref 0–44)
AST: 20 U/L (ref 15–41)
Albumin: 3.2 g/dL — ABNORMAL LOW (ref 3.5–5.0)
Alkaline Phosphatase: 52 U/L (ref 38–126)
Anion gap: 12 (ref 5–15)
BUN: 5 mg/dL — ABNORMAL LOW (ref 8–23)
CO2: 22 mmol/L (ref 22–32)
Calcium: 8.8 mg/dL — ABNORMAL LOW (ref 8.9–10.3)
Chloride: 103 mmol/L (ref 98–111)
Creatinine, Ser: 1.05 mg/dL (ref 0.61–1.24)
GFR calc Af Amer: >60 mL/min (ref >60)
GFR calc non Af Amer: >60 mL/min (ref >60)
Glucose, Bld: 154 mg/dL — ABNORMAL HIGH (ref 70–99)
Potassium: 2.9 mmol/L — ABNORMAL LOW (ref 3.5–5.1)
Sodium: 137 mmol/L (ref 135–145)
Total Bilirubin: 0.9 mg/dL (ref 0.3–1.2)
Total Protein: 7.4 g/dL (ref 6.5–8.1)

## 2018-11-14 LAB — URINALYSIS, ROUTINE W REFLEX MICROSCOPIC
Bilirubin Urine: NEGATIVE
Glucose, UA: NEGATIVE mg/dL
Hgb urine dipstick: NEGATIVE
Ketones, ur: NEGATIVE mg/dL
Leukocytes,Ua: NEGATIVE
Nitrite: NEGATIVE
Protein, ur: NEGATIVE mg/dL
Specific Gravity, Urine: 1.009 (ref 1.005–1.030)
pH: 6 (ref 5.0–8.0)

## 2018-11-14 LAB — LIPASE, BLOOD: Lipase: 25 U/L (ref 11–51)

## 2018-11-14 MED ORDER — SODIUM CHLORIDE 0.9% FLUSH: 3.0000 mL | Freq: Once | INTRAVENOUS | Status: DC

## 2018-11-14 NOTE — ED Triage Notes (Signed)
Pt in c/o headache and dizziness onset x 2 days and abd pain, pt denies n/v/d, no slurred speech, no unilateral weakness, no visual changes, no facial droop, pt mae, pt ambulatory, A&o x4

## 2018-11-15 ENCOUNTER — Other Ambulatory Visit: Payer: Self-pay

## 2018-11-15 ENCOUNTER — Emergency Department (HOSPITAL_COMMUNITY): Payer: Medicare HMO

## 2018-11-15 DIAGNOSIS — R42 Dizziness and giddiness: Secondary | ICD-10-CM | POA: Diagnosis not present

## 2018-11-15 MED ORDER — MECLIZINE HCL 25 MG PO TABS: 25.0000 mg | ORAL_TABLET | Freq: Three times a day (TID) | ORAL | 0 refills | Status: DC | PRN

## 2018-11-15 NOTE — ED Notes (Signed)
Patient transported to MRI 

## 2018-11-15 NOTE — ED Provider Notes (Signed)
[facility] EMERGENCY DEPARTMENT Provider Note   CSN: [id number] Arrival date & time: 11/14/18  1402     History   Chief Complaint Chief Complaint  Patient presents with  . Dizziness    HPI CORDARRIUS COAD is a 68 y.o. male.     Patient is a 68 year old male with no significant past medical history.  He presents today for evaluation of dizziness.  This is been occurring intermittently for the past 2 days.  He describes a spinning sensation that comes and goes when he changes position.  He reports associated nausea but no vomiting.  He denies any fevers or chills.  He does report having had a headache for the past 2 days.  He has had no injury or trauma.  The history is provided by the patient.  Dizziness Quality:  Room spinning Severity:  Moderate Onset quality:  Sudden Duration:  2 days Timing:  Intermittent Progression:  Worsening Chronicity:  New Relieved by:  Nothing Worsened by:  Movement, standing up and turning head Ineffective treatments:  None tried   History reviewed. No pertinent past medical history.  There are no active problems to display for this patient.   History reviewed. No pertinent surgical history.      Home Medications    Prior to Admission medications   Not on File    Family History No family history on file.  Social History Social History   Tobacco Use  . Smoking status: Never Smoker  . Smokeless tobacco: Never Used  Substance Use Topics  . Alcohol use: No  . Drug use: No     Allergies   Patient has no known allergies.   Review of Systems Review of Systems  Neurological: Positive for dizziness.  All other systems reviewed and are negative.    Physical Exam Updated Vital Signs BP (!) 147/68 (BP Location: Right Arm)   Pulse 68   Temp 98.2 F (36.8 C) (Oral)   Resp 18   SpO2 100%   Physical Exam Vitals signs and nursing note reviewed.  Constitutional:      General: He is not in acute  distress.    Appearance: He is well-developed. He is not diaphoretic.  HENT:     Head: Normocephalic and atraumatic.  Eyes:     Extraocular Movements: Extraocular movements intact.     Pupils: Pupils are equal, round, and reactive to light.  Neck:     Musculoskeletal: Normal range of motion and neck supple.  Cardiovascular:     Rate and Rhythm: Normal rate and regular rhythm.     Heart sounds: No murmur. No friction rub.  Pulmonary:     Effort: Pulmonary effort is normal. No respiratory distress.     Breath sounds: Normal breath sounds. No wheezing or rales.  Abdominal:     General: Bowel sounds are normal. There is no distension.     Palpations: Abdomen is soft.     Tenderness: There is no abdominal tenderness.  Musculoskeletal: Normal range of motion.  Skin:    General: Skin is warm and dry.  Neurological:     General: No focal deficit present.     Mental Status: He is alert and oriented to person, place, and time.     Cranial Nerves: No cranial nerve deficit.     Sensory: No sensory deficit.     Motor: No weakness.     Coordination: Coordination normal.      ED Treatments / Results  Labs (all labs ordered are listed, but only abnormal results are displayed) Labs Reviewed  COMPREHENSIVE METABOLIC PANEL - Abnormal; Notable for the following components:      Result Value   Potassium 2.9 (*)    Glucose, Bld 154 (*)    BUN 5 (*)    Calcium 8.8 (*)    Albumin 3.2 (*)    All other components within normal limits  LIPASE, BLOOD  CBC  URINALYSIS, ROUTINE W REFLEX MICROSCOPIC    EKG ED ECG REPORT   Date: 11/15/2018  Rate: 68  Rhythm: normal sinus rhythm  QRS Axis: normal  Intervals: normal  ST/T Wave abnormalities: nonspecific T wave changes  Conduction Disutrbances:none  Narrative Interpretation:   Old EKG Reviewed: unchanged  I have personally reviewed the EKG tracing and agree with the computerized printout as noted.   Radiology No results found.   Procedures Procedures (including critical care time)  Medications Ordered in ED Medications  sodium chloride flush (NS) 0.9 % injection 3 mL (has no administration in time range)     Initial Impression / Assessment and Plan / ED Course  I have reviewed the triage vital signs and the nursing notes.  Pertinent labs & imaging results that were available during my care of the patient were reviewed by me and considered in my medical decision making (see chart for details).  Patient presents here with complaints of dizziness that he describes as a spinning sensation.  This appears to be a peripheral vertigo as there is no evidence for stroke on his MRI.  His symptoms are also worse when he changes position.  He states the symptoms had resolved during his prolonged wait in the waiting room (this was close to 16 hours).  He will be discharged with meclizine which he can take if his symptoms return.  Final Clinical Impressions(s) / ED Diagnoses   Final diagnoses:  None    ED Discharge Orders    None       Geoffery Lyonselo, Noemy Hallmon, MD 11/15/18 1135

## 2018-11-15 NOTE — Discharge Instructions (Addendum)
Begin taking meclizine as prescribed as needed for dizziness. ° °Follow-up with your primary doctor if symptoms or not improving in the next week, and return to the ER if symptoms significantly worsen or change. °

## 2018-12-14 DIAGNOSIS — Z809 Family history of malignant neoplasm, unspecified: Secondary | ICD-10-CM | POA: Diagnosis not present

## 2018-12-14 DIAGNOSIS — E669 Obesity, unspecified: Secondary | ICD-10-CM | POA: Diagnosis not present

## 2018-12-14 DIAGNOSIS — R42 Dizziness and giddiness: Secondary | ICD-10-CM | POA: Diagnosis not present

## 2018-12-14 DIAGNOSIS — R030 Elevated blood-pressure reading, without diagnosis of hypertension: Secondary | ICD-10-CM | POA: Diagnosis not present

## 2018-12-14 DIAGNOSIS — K08109 Complete loss of teeth, unspecified cause, unspecified class: Secondary | ICD-10-CM | POA: Diagnosis not present

## 2018-12-14 DIAGNOSIS — H269 Unspecified cataract: Secondary | ICD-10-CM | POA: Diagnosis not present

## 2018-12-14 DIAGNOSIS — N529 Male erectile dysfunction, unspecified: Secondary | ICD-10-CM | POA: Diagnosis not present

## 2018-12-14 DIAGNOSIS — Z6835 Body mass index (BMI) 35.0-35.9, adult: Secondary | ICD-10-CM | POA: Diagnosis not present

## 2018-12-14 DIAGNOSIS — Z7722 Contact with and (suspected) exposure to environmental tobacco smoke (acute) (chronic): Secondary | ICD-10-CM | POA: Diagnosis not present

## 2018-12-14 DIAGNOSIS — R69 Illness, unspecified: Secondary | ICD-10-CM | POA: Diagnosis not present

## 2019-04-27 ENCOUNTER — Ambulatory Visit: Payer: Medicare (Managed Care) | Attending: Internal Medicine

## 2019-04-27 DIAGNOSIS — Z23 Encounter for immunization: Secondary | ICD-10-CM | POA: Insufficient documentation

## 2019-04-27 NOTE — Progress Notes (Signed)
   Covid-19 Vaccination Clinic  Name:  CURTIES CONIGLIARO    MRN: [id number] DOB: 1951/01/15  04/27/2019  Mr. Tagle was observed post Covid-19 immunization for 15 minutes without incidence. He was provided with Vaccine Information Sheet and instruction to access the V-Safe system.   Mr. Malerba was instructed to call 911 with any severe reactions post vaccine: Marland Kitchen Difficulty breathing  . Swelling of your face and throat  . A fast heartbeat  . A bad rash all over your body  . Dizziness and weakness    Immunizations Administered    Name Date Dose VIS Date Route   Pfizer COVID-19 Vaccine 04/27/2019  2:35 PM 0.3 mL 02/09/2019 Intramuscular   Manufacturer: [employer], [employer]   Lot: [id number]   NDC: [address]-5

## 2019-05-23 ENCOUNTER — Ambulatory Visit: Payer: Medicare Other

## 2020-07-13 ENCOUNTER — Encounter (HOSPITAL_COMMUNITY): Payer: Self-pay

## 2020-07-13 ENCOUNTER — Other Ambulatory Visit: Payer: Self-pay

## 2020-07-13 ENCOUNTER — Emergency Department (HOSPITAL_COMMUNITY)
Admission: EM | Admit: 2020-07-13 | Discharge: 2020-07-14 | Disposition: A | Discharge status: Home / Self Care | Payer: Medicare Other | Attending: Emergency Medicine | Admitting: Emergency Medicine

## 2020-07-13 DIAGNOSIS — R0789 Other chest pain: Secondary | ICD-10-CM | POA: Diagnosis not present

## 2020-07-13 DIAGNOSIS — R42 Dizziness and giddiness: Secondary | ICD-10-CM | POA: Diagnosis not present

## 2020-07-13 LAB — URINALYSIS, ROUTINE W REFLEX MICROSCOPIC
Bacteria, UA: NONE SEEN
Bilirubin Urine: NEGATIVE
Glucose, UA: NEGATIVE mg/dL
Ketones, ur: NEGATIVE mg/dL
Leukocytes,Ua: NEGATIVE
Nitrite: NEGATIVE
Protein, ur: NEGATIVE mg/dL
Specific Gravity, Urine: 1.017 (ref 1.005–1.030)
pH: 5 (ref 5.0–8.0)

## 2020-07-13 LAB — CBC
HCT: 43.9 % (ref 39.0–52.0)
Hemoglobin: 14.4 g/dL (ref 13.0–17.0)
MCH: 29.2 pg (ref 26.0–34.0)
MCHC: 32.8 g/dL (ref 30.0–36.0)
MCV: 89 fL (ref 80.0–100.0)
Platelets: 241 10*3/uL (ref 150–400)
RBC: 4.93 MIL/uL (ref 4.22–5.81)
RDW: 12.3 % (ref 11.5–15.5)
WBC: 7 10*3/uL (ref 4.0–10.5)
nRBC: 0 % (ref 0.0–0.2)

## 2020-07-13 LAB — BASIC METABOLIC PANEL
Anion gap: 6 (ref 5–15)
BUN: 9 mg/dL (ref 8–23)
CO2: 29 mmol/L (ref 22–32)
Calcium: 9.8 mg/dL (ref 8.9–10.3)
Chloride: 104 mmol/L (ref 98–111)
Creatinine, Ser: 1.12 mg/dL (ref 0.61–1.24)
GFR, Estimated: >60 mL/min (ref >60)
Glucose, Bld: 123 mg/dL — ABNORMAL HIGH (ref 70–99)
Potassium: 4.2 mmol/L (ref 3.5–5.1)
Sodium: 139 mmol/L (ref 135–145)

## 2020-07-13 NOTE — ED Triage Notes (Signed)
Patient reports chest pressure and dizziness starting today, states mostly positional dizziness when sitting to standing

## 2020-07-13 NOTE — ED Provider Notes (Signed)
Emergency Medicine Provider Triage Evaluation Note  Ross Valdez , a 70 y.o. male  was evaluated in triage.  Pt complains of chest pain and dizziness beginning this morning.  Review of Systems  Positive: Chest pain, dizziness Negative: Syncope, vomiting, shortness of breath and cough  Physical Exam  BP 137/82 (BP Location: Right Arm)   Pulse 93   Temp 99.3 F (37.4 C)   Resp 16   Ht 5\' 8"  (1.727 m)   Wt 83.9 kg   SpO2 97%   BMI 28.12 kg/m   Gen:   Awake, no distress   Resp:  Normal effort  MSK:   Moves extremities without difficulty  Other:    Medical Decision Making  Medically screening exam initiated at 8:59 PM.  Appropriate orders placed.  was informed that the remainder of the evaluation will be completed by another provider, this initial triage assessment does not replace that evaluation, and the importance of remaining in the ED until their evaluation is complete.     Helane Rima 07/13/20 2108    2109, MD 07/14/20 1726

## 2020-07-14 NOTE — Discharge Instructions (Addendum)
Recheck with your doctor, return to the ER for new, worsening, concerning symptoms.

## 2020-07-14 NOTE — ED Provider Notes (Signed)
[facility] EMERGENCY DEPARTMENT Provider Note   CSN: [id number] Arrival date & time: 07/13/20  2002     History Chief Complaint  Patient presents with  . Dizziness  . Chest Pain    Ross Valdez is a 70 y.o. male.  70 year old male with complaint of feeling unsteady last night after eating pork and beans and taking his sleeping pill. Reports feeling like he had indigestion, had epigastric pressure, stood up to walk and was dizzy/unsteady. Patient came to the ER and after an extensive wait reports his symptoms have completely resolved. Denies ever feeling nausea, short of breath, weak or numb. No changes in vision or speech. No other complaints or concerns.         History reviewed. No pertinent past medical history.  There are no problems to display for this patient.   History reviewed. No pertinent surgical history.     History reviewed. No pertinent family history.  Social History   Tobacco Use  . Smoking status: Never Smoker  . Smokeless tobacco: Never Used  Substance Use Topics  . Alcohol use: No  . Drug use: No    Home Medications Prior to Admission medications   Medication Sig Start Date End Date Taking? Authorizing Provider  diphenhydramine-acetaminophen (TYLENOL PM EXTRA STRENGTH) 25-500 MG TABS tablet Take 2 tablets by mouth at bedtime.   Yes [provider]    Allergies    Patient has no known allergies.  Review of Systems   Review of Systems  Constitutional: Negative for fever.  Eyes: Negative for visual disturbance.  Respiratory: Negative for shortness of breath.   Cardiovascular: Negative for chest pain.  Gastrointestinal: Positive for abdominal pain. Negative for nausea and vomiting.  Genitourinary: Negative for difficulty urinating.  Musculoskeletal: Negative for arthralgias and myalgias.  Skin: Negative for rash and wound.  Neurological: Positive for dizziness. Negative for speech difficulty, weakness and  headaches.  Psychiatric/Behavioral: Negative for confusion.  All other systems reviewed and are negative.   Physical Exam Updated Vital Signs BP 123/75   Pulse 77   Temp 99 F (37.2 C) (Oral)   Resp (!) 22   Ht 5\' 8"  (1.727 m)   Wt 83.9 kg   SpO2 98%   BMI 28.12 kg/m   Physical Exam Vitals and nursing note reviewed.  Constitutional:      General: He is not in acute distress.    Appearance: He is well-developed. He is not diaphoretic.  HENT:     Head: Normocephalic and atraumatic.  Cardiovascular:     Rate and Rhythm: Normal rate and regular rhythm.     Heart sounds: Normal heart sounds. No murmur heard.   Pulmonary:     Effort: Pulmonary effort is normal.     Breath sounds: Normal breath sounds.  Chest:     Chest wall: No tenderness.  Abdominal:     Palpations: Abdomen is soft.     Tenderness: There is no abdominal tenderness.  Musculoskeletal:     Cervical back: Neck supple.     Right lower leg: No edema.     Left lower leg: No edema.  Skin:    General: Skin is warm and dry.     Findings: No ecchymosis or rash.  Neurological:     Mental Status: He is alert and oriented to person, place, and time.     Cranial Nerves: No cranial nerve deficit.     Motor: No weakness.  Psychiatric:  Behavior: Behavior normal.     ED Results / Procedures / Treatments   Labs (all labs ordered are listed, but only abnormal results are displayed) Labs Reviewed  BASIC METABOLIC PANEL - Abnormal; Notable for the following components:      Result Value   Glucose, Bld 123 (*)    All other components within normal limits  URINALYSIS, ROUTINE W REFLEX MICROSCOPIC - Abnormal; Notable for the following components:   APPearance HAZY (*)    Hgb urine dipstick SMALL (*)    All other components within normal limits  CBC  CBG MONITORING, ED    EKG None  Radiology No results found.  Procedures Procedures   Medications Ordered in ED Medications - No data to  display  ED Course  I have reviewed the triage vital signs and the nursing notes.  Pertinent labs & imaging results that were available during my care of the patient were reviewed by me and considered in my medical decision making (see chart for details).  Clinical Course as of 07/14/20 0708  Mon Jul 14, 2020  2226 70 year old male with complaint of feeling unsteady last night after taking his sleeping pill. States he had just eaten pork and beans for dinner and had some pressure in the epigastric area as well. After his 10 hour wait, his symptoms have completely resolved. No history of similar symptoms previously. Patient is ambulatory with a steady gait, abdomen is soft an non tender. EKG without acute ischemic changes. Labs reassuring including CBC, BMP, UA. Vitals without significant findings including O2 sat of 98% on room air.  Patient would like to be dc, agrees to recheck with PCP, return to the ER for new or worsening symptoms.  [LM]    Clinical Course User Index [LM] Alden Hipp   MDM Rules/Calculators/A&P                          Final Clinical Impression(s) / ED Diagnoses Final diagnoses:  Dizziness    Rx / DC Orders ED Discharge Orders    None       Alden Hipp 07/14/20 0708    Nira Conn, MD 07/14/20 2115

## 2021-05-01 DIAGNOSIS — E669 Obesity, unspecified: Secondary | ICD-10-CM | POA: Diagnosis not present

## 2021-05-01 DIAGNOSIS — Z6833 Body mass index (BMI) 33.0-33.9, adult: Secondary | ICD-10-CM | POA: Diagnosis not present

## 2021-05-01 DIAGNOSIS — Z823 Family history of stroke: Secondary | ICD-10-CM | POA: Diagnosis not present

## 2021-05-01 DIAGNOSIS — Z008 Encounter for other general examination: Secondary | ICD-10-CM | POA: Diagnosis not present

## 2021-05-01 DIAGNOSIS — Z87891 Personal history of nicotine dependence: Secondary | ICD-10-CM | POA: Diagnosis not present

## 2021-05-01 DIAGNOSIS — R030 Elevated blood-pressure reading, without diagnosis of hypertension: Secondary | ICD-10-CM | POA: Diagnosis not present

## 2021-05-01 DIAGNOSIS — N529 Male erectile dysfunction, unspecified: Secondary | ICD-10-CM | POA: Diagnosis not present

## 2021-05-01 DIAGNOSIS — I951 Orthostatic hypotension: Secondary | ICD-10-CM | POA: Diagnosis not present

## 2023-11-17 ENCOUNTER — Ambulatory Visit (INDEPENDENT_AMBULATORY_CARE_PROVIDER_SITE_OTHER): Admitting: Primary Care
